# Patient Record
Sex: Female | Born: 1995 | Race: White | Hispanic: No | Marital: Single
Health system: Southern US, Community
[De-identification: ages and names within clinical notes are randomized; demographics above are authoritative.]

---
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
## 2019-09-09 ENCOUNTER — Ambulatory Visit: Payer: Medicaid Other

## 2019-09-09 ENCOUNTER — Other Ambulatory Visit: Payer: Self-pay

## 2019-09-09 DIAGNOSIS — Z0283 Encounter for blood-alcohol and blood-drug test: Secondary | ICD-10-CM

## 2019-09-09 NOTE — Progress Notes (Signed)
Presents for pre-employment drug screen. Specimen collected using [employer] Chain of Custody form for [facility] account number [id number]. Specimen ID [id number]  2:30 pm - 1st attempt - insufficient amount given 2:30 pm - 8 oz bottle of water given 3:00 pm - 8 oz bottle of water given 3:30 pm - 8 oz bottle of water given 4:20 - 2nd attempt - sufficient quantity provided  AMD
